# Patient Record
Sex: Female | Born: 1995
Health system: Southern US, Community
[De-identification: ages and names within clinical notes are randomized; demographics above are authoritative.]

## PROBLEM LIST (undated history)

## (undated) ENCOUNTER — Ambulatory Visit (HOSPITAL_COMMUNITY): Admission: EM | Source: Home / Self Care

## (undated) DIAGNOSIS — L309 Dermatitis, unspecified: Secondary | ICD-10-CM

## (undated) HISTORY — DX: Dermatitis, unspecified: L30.9

---
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
## 2017-04-13 IMAGING — CR DG CHEST 2V
2 series · 2 of 2 positions shown · non-contrast
Comparison: None in PACs

CLINICAL DATA: Cough and chest congestion, orthopnea,
musculoskeletal chest pain.

EXAM:
CHEST  2 VIEW

[w chest pa]
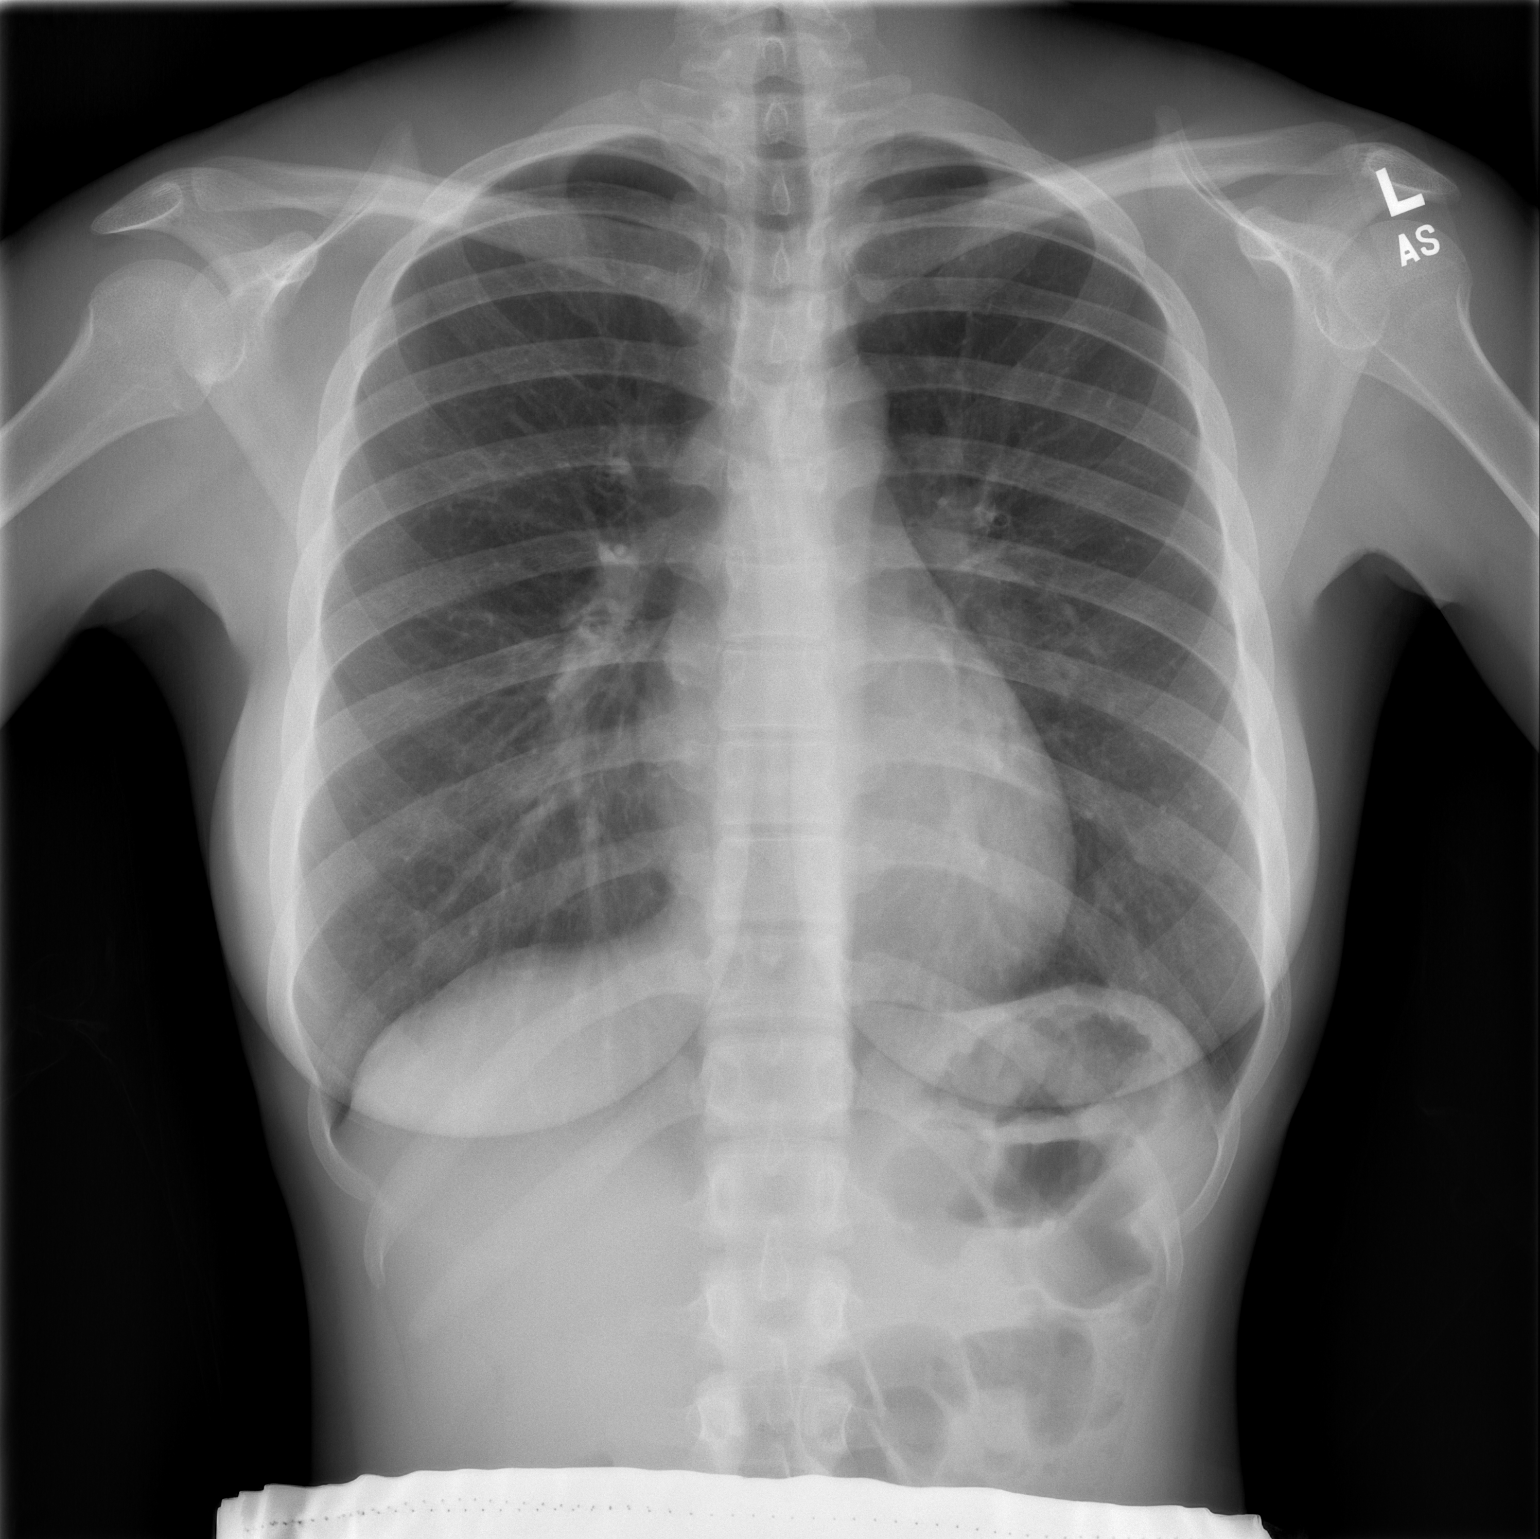

[w chest lat]
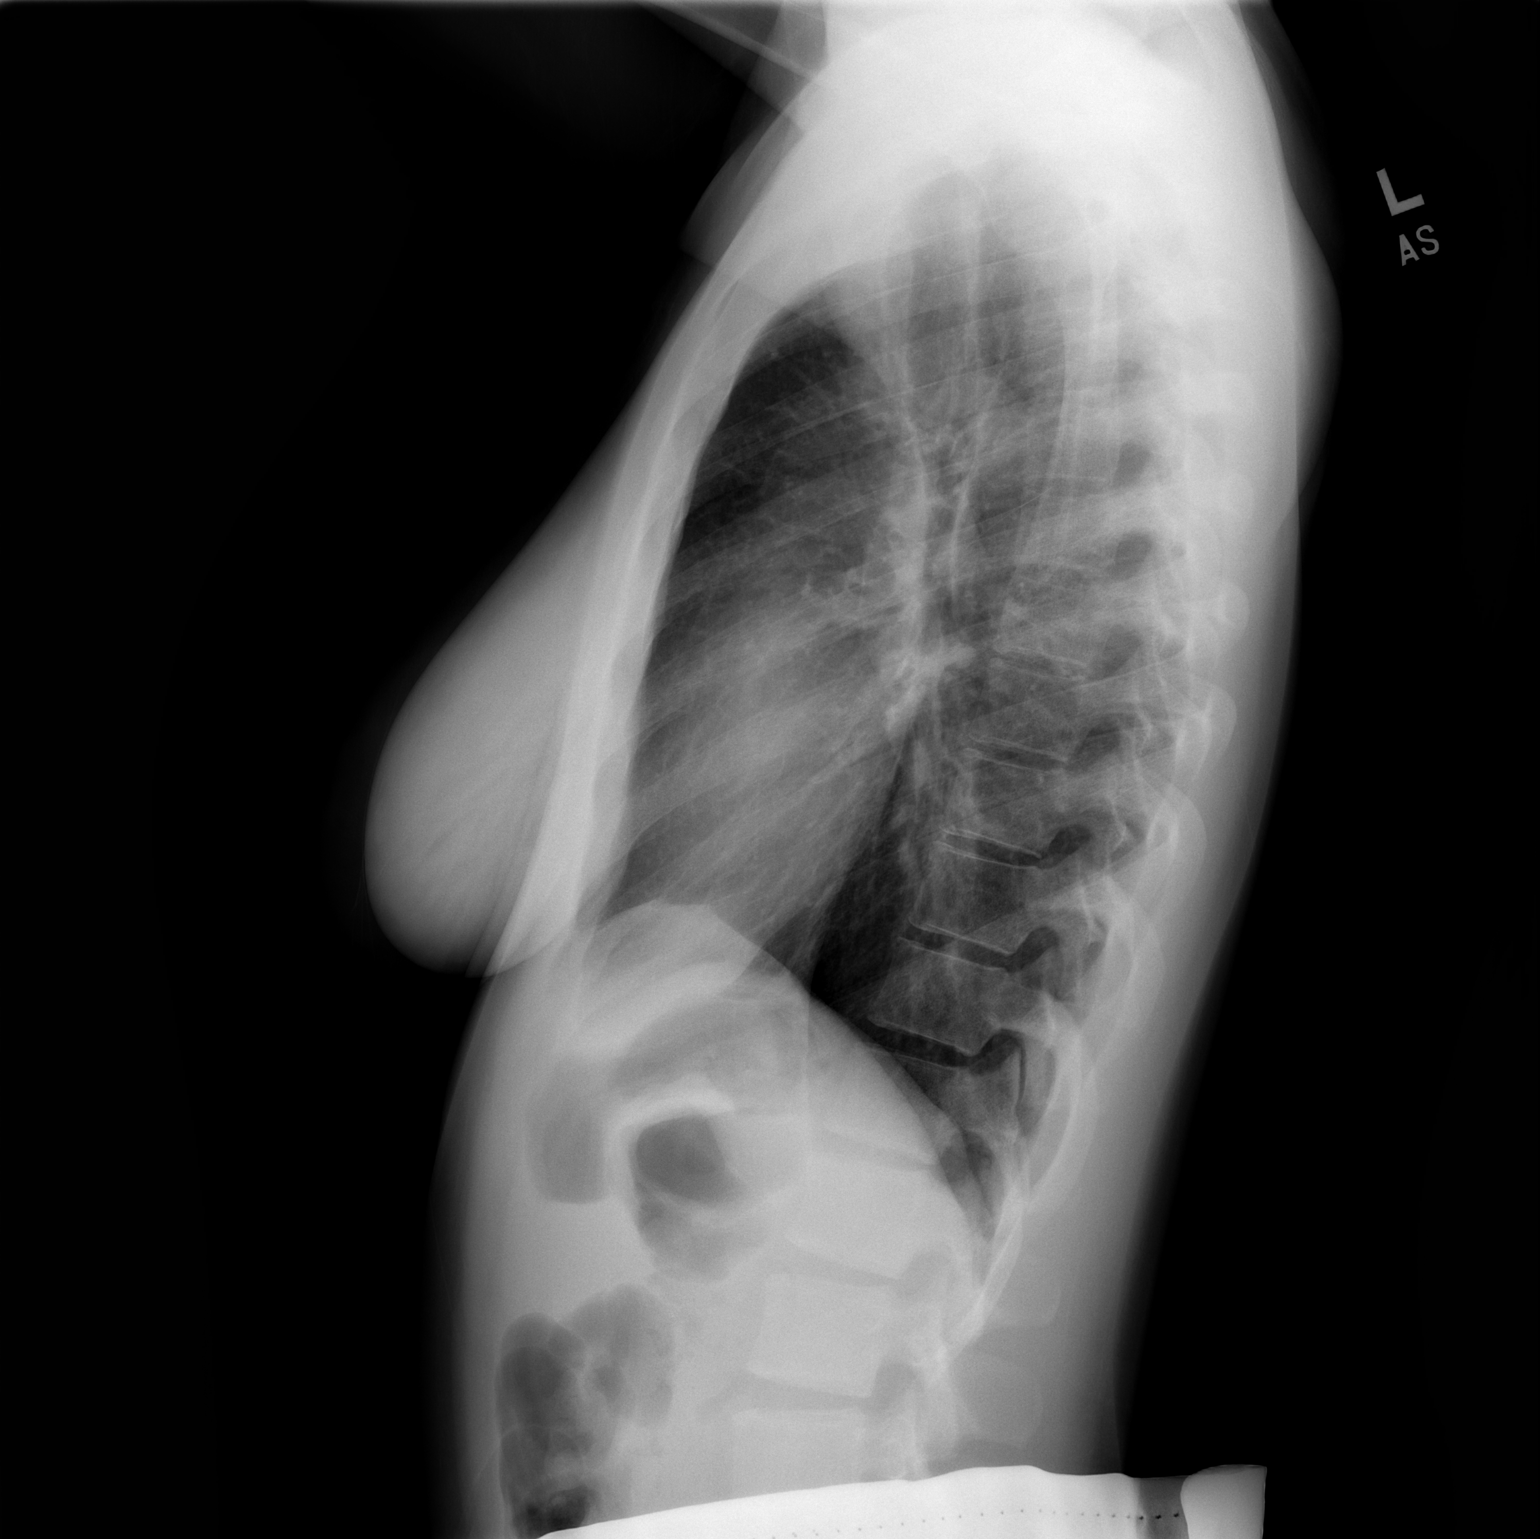

[2 of 2 positions shown; findings below may reference images not displayed]

FINDINGS: The lungs are mildly hyperinflated. There is no alveolar infiltrate.
There is no pleural effusion or pneumothorax. The cardiopericardial
silhouette is normal in size. The mediastinum is normal in width.
The bony thorax exhibits no acute abnormality.
IMPRESSION: Mild air trapping may reflect acute bronchitis. There is no alveolar
pneumonia.

## 2017-12-22 ENCOUNTER — Other Ambulatory Visit: Payer: Self-pay | Admitting: Obstetrics and Gynecology

## 2017-12-22 DIAGNOSIS — N6320 Unspecified lump in the left breast, unspecified quadrant: Secondary | ICD-10-CM

## 2017-12-22 DIAGNOSIS — N6310 Unspecified lump in the right breast, unspecified quadrant: Secondary | ICD-10-CM

## 2018-07-22 ENCOUNTER — Ambulatory Visit (HOSPITAL_COMMUNITY)
Admission: EM | Admit: 2018-07-22 | Discharge: 2018-07-22 | Disposition: A | Discharge status: Home / Self Care | Payer: BC Managed Care – PPO | Attending: Emergency Medicine | Admitting: Emergency Medicine

## 2018-07-22 ENCOUNTER — Other Ambulatory Visit: Payer: Self-pay

## 2018-07-22 DIAGNOSIS — J029 Acute pharyngitis, unspecified: Secondary | ICD-10-CM | POA: Insufficient documentation

## 2018-07-22 LAB — POCT RAPID STREP A: Streptococcus, Group A Screen (Direct): NEGATIVE

## 2018-07-22 MED ORDER — AMOXICILLIN 500 MG PO CAPS: 500.0000 mg | ORAL_CAPSULE | Freq: Three times a day (TID) | ORAL | 0 refills | Status: DC

## 2018-07-22 NOTE — ED Provider Notes (Signed)
[facility]    CSN: [id number] Arrival date & time: 07/22/18  1121     History   Chief Complaint Chief Complaint  Patient presents with  . Sore Throat    HPI [name] is a 23 y.o. female.   Patient presents with sore throat and swollen lymph node on the right side of her neck x1 week.  She denies fever, chills, otalgia, dysphagia, cough, shortness of breath, vomiting, diarrhea.  LMP: 3 weeks.  The history is provided by the patient.    Past Medical History:  Diagnosis Date  . Eczema     Patient Active Problem List   Diagnosis Date Noted  . Mass of breast, right 08/10/2013    No past surgical history on file.  OB History   No obstetric history on file.      Home Medications    Prior to Admission medications   Medication Sig Start Date End Date Taking? Authorizing Provider  amoxicillin (AMOXIL) 500 MG capsule Take 1 capsule (500 mg total) by mouth 3 (three) times daily for 10 days. 07/22/18 08/01/18  Mickie Bailate, Yilia Sacca H, NP    Family History Family History  Problem Relation Age of Onset  .  () Unknown     Social History Social History   Tobacco Use  . Smoking status: Never Smoker  Substance Use Topics  . Alcohol use: Not on file  . Drug use: Not on file     Allergies   Patient has no known allergies.   Review of Systems Review of Systems  Constitutional: Negative for chills and fever.  HENT: Positive for sore throat. Negative for congestion, ear pain, rhinorrhea, sinus pressure and trouble swallowing.   Eyes: Negative for pain and visual disturbance.  Respiratory: Negative for cough and shortness of breath.   Cardiovascular: Negative for chest pain and palpitations.  Gastrointestinal: Negative for abdominal pain, diarrhea and vomiting.  Genitourinary: Negative for dysuria and hematuria.  Musculoskeletal: Negative for arthralgias and back pain.  Skin: Negative for color change and rash.  Neurological: Negative for seizures and  syncope.  All other systems reviewed and are negative.    Physical Exam Triage Vital Signs ED Triage Vitals [07/22/18 1211]  Enc Vitals Group     BP 122/61     Pulse Rate 91     Resp 16     Temp 98.4 F (36.9 C)     Temp Source Oral     SpO2 100 %     Weight      Height      Head Circumference      Peak Flow      Pain Score 8     Pain Loc      Pain Edu?      Excl. in GC?    No data found.  Updated Vital Signs BP 122/61 (BP Location: Right Arm)   Pulse 91   Temp 98.4 F (36.9 C) (Oral)   Resp 16   SpO2 100%   Visual Acuity Right Eye Distance:   Left Eye Distance:   Bilateral Distance:    Right Eye Near:   Left Eye Near:    Bilateral Near:     Physical Exam Vitals signs and nursing note reviewed.  Constitutional:      General: She is not in acute distress.    Appearance: She is well-developed.  HENT:     Head: Normocephalic and atraumatic.     Right Ear: Tympanic membrane normal.  Left Ear: Tympanic membrane normal.     Nose: Nose normal.     Mouth/Throat:     Mouth: Mucous membranes are moist.     Pharynx: Posterior oropharyngeal erythema present. No oropharyngeal exudate.  Eyes:     Conjunctiva/sclera: Conjunctivae normal.  Neck:     Musculoskeletal: Neck supple.  Cardiovascular:     Rate and Rhythm: Normal rate and regular rhythm.  Pulmonary:     Effort: Pulmonary effort is normal. No respiratory distress.     Breath sounds: Normal breath sounds.  Abdominal:     Palpations: Abdomen is soft.     Tenderness: There is no abdominal tenderness. There is no guarding or rebound.  Lymphadenopathy:     Cervical: Cervical adenopathy present.  Skin:    General: Skin is warm and dry.  Neurological:     Mental Status: She is alert.      UC Treatments / Results  Labs (all labs ordered are listed, but only abnormal results are displayed) Labs Reviewed  CULTURE, GROUP A STREP [facility])  POCT RAPID STREP A    EKG   Radiology No results found.   Procedures Procedures (including critical care time)  Medications Ordered in UC Medications - No data to display  Initial Impression / Assessment and Plan / UC Course  I have reviewed the triage vital signs and the nursing notes.  Pertinent labs & imaging results that were available during my care of the patient were reviewed by me and considered in my medical decision making (see chart for details).   Acute pharyngitis.  Rapid strep negative today; culture pending.  Treating with amoxicillin x10 days, Tylenol or ibuprofen as needed for pain.  Discussed with patient that she should return here or follow-up with her PCP if she develops worsening pain, fever, chills, otalgia, cough, shortness of breath, vomiting, diarrhea, other concerns.  Discussed that she should go to the emergency department if she develops difficulty swallowing or breathing.     Final Clinical Impressions(s) / UC Diagnoses   Final diagnoses:  Acute pharyngitis, unspecified etiology     Discharge Instructions     Take the amoxicillin as prescribed for 10 days.  Take ibuprofen or Tylenol as needed for pain.    Return here or follow-up with your primary care provider if you develop worsening pain, fever, chills, earache, cough, shortness of breath, vomiting, diarrhea, other concerns.        ED Prescriptions    Medication Sig Dispense Auth. Provider   amoxicillin (AMOXIL) 500 MG capsule Take 1 capsule (500 mg total) by mouth 3 (three) times daily for 10 days. 30 capsule Sharion Balloon, NP     Controlled Substance Prescriptions [place] Controlled Substance Registry consulted? Not Applicable   Sharion Balloon, NP 07/22/18 1310

## 2018-07-22 NOTE — Discharge Instructions (Signed)
Take the amoxicillin as prescribed for 10 days.  Take ibuprofen or Tylenol as needed for pain.    Return here or follow-up with your primary care provider if you develop worsening pain, fever, chills, earache, cough, shortness of breath, vomiting, diarrhea, other concerns.

## 2018-07-22 NOTE — ED Triage Notes (Signed)
Per pt she has been having a sore throat that only hurts on the right side. Swelling in her tonsil and neck pain. No fevers, no chills

## 2018-07-24 LAB — CULTURE, GROUP A STREP (THRC)

## 2018-07-27 ENCOUNTER — Ambulatory Visit (HOSPITAL_COMMUNITY)
Admission: EM | Admit: 2018-07-27 | Discharge: 2018-07-27 | Disposition: A | Discharge status: Home / Self Care | Payer: BC Managed Care – PPO | Attending: Urgent Care | Admitting: Urgent Care

## 2018-07-27 ENCOUNTER — Encounter (HOSPITAL_COMMUNITY): Payer: Self-pay | Admitting: Emergency Medicine

## 2018-07-27 ENCOUNTER — Other Ambulatory Visit: Payer: Self-pay

## 2018-07-27 DIAGNOSIS — R0981 Nasal congestion: Secondary | ICD-10-CM | POA: Diagnosis present

## 2018-07-27 DIAGNOSIS — R059 Cough, unspecified: Secondary | ICD-10-CM

## 2018-07-27 DIAGNOSIS — B349 Viral infection, unspecified: Secondary | ICD-10-CM

## 2018-07-27 DIAGNOSIS — R432 Parageusia: Secondary | ICD-10-CM

## 2018-07-27 DIAGNOSIS — R430 Anosmia: Secondary | ICD-10-CM

## 2018-07-27 DIAGNOSIS — U071 COVID-19: Secondary | ICD-10-CM | POA: Insufficient documentation

## 2018-07-27 DIAGNOSIS — J029 Acute pharyngitis, unspecified: Secondary | ICD-10-CM

## 2018-07-27 DIAGNOSIS — R05 Cough: Secondary | ICD-10-CM

## 2018-07-27 LAB — POCT INFECTIOUS MONO SCREEN: Mono Screen: NEGATIVE

## 2018-07-27 NOTE — ED Triage Notes (Signed)
PT was seen last week for sore throat and was treated with antibiotics. PT took a few days of antibiotics, but quit two days ago. PT reports loss of taste and smell.

## 2018-07-27 NOTE — Discharge Instructions (Addendum)

## 2018-07-27 NOTE — ED Provider Notes (Addendum)
MRN: [id number] DOB: [birth year]  Subjective:   Jacqueline Schaefer is a 23 y.o. female presenting for recheck on her pharyngitis.  Reports that she has been ill for approximately 2 weeks.  Patient was seen last week and started on amoxicillin for clinical treatment of pharyngitis.  She reports that her symptoms have improved slightly but she still has head congestion, dry cough worse at night.  She has now lost her sense of taste and smell.  She stopped amoxicillin to see if this was a side effect but has not noticed a difference while being on amoxicillin.  Patient works as a Programme researcher, broadcasting/film/video, works at [employer].  Would like to get tested for COVID-19.  No current facility-administered medications for this encounter.   Current Outpatient Medications:  .  amoxicillin (AMOXIL) 500 MG capsule, Take 1 capsule (500 mg total) by mouth 3 (three) times daily for 10 days., Disp: 30 capsule, Rfl: 0   No Known Allergies  Past Medical History:  Diagnosis Date  . Eczema     History reviewed. No pertinent surgical history.  Review of Systems  Constitutional: Negative for fever and malaise/fatigue.  HENT: Positive for congestion and sore throat. Negative for ear pain and sinus pain.   Eyes: Negative for blurred vision, double vision, discharge and redness.  Respiratory: Positive for cough. Negative for hemoptysis, shortness of breath and wheezing.   Cardiovascular: Negative for chest pain.  Gastrointestinal: Negative for abdominal pain, diarrhea, nausea and vomiting.  Genitourinary: Negative for dysuria, flank pain and hematuria.  Musculoskeletal: Negative for myalgias.  Skin: Negative for rash.  Neurological: Negative for dizziness, weakness and headaches.  Psychiatric/Behavioral: Negative for depression and substance abuse.    Objective:   Vitals: BP 127/80   Pulse 72   Temp 99 F (37.2 C) (Oral)   Resp 16   LMP 06/21/2018   SpO2 100%   Physical Exam Constitutional:      General: She is not in acute  distress.    Appearance: Normal appearance. She is well-developed. She is not ill-appearing, toxic-appearing or diaphoretic.  HENT:     Head: Normocephalic and atraumatic.     Right Ear: Tympanic membrane and ear canal normal. No drainage or tenderness. No middle ear effusion. Tympanic membrane is not erythematous.     Left Ear: Tympanic membrane and ear canal normal. No drainage or tenderness.  No middle ear effusion. Tympanic membrane is not erythematous.     Nose: Nose normal. No congestion or rhinorrhea.     Mouth/Throat:     Mouth: Mucous membranes are moist. No oral lesions.     Pharynx: Oropharynx is clear. No pharyngeal swelling, oropharyngeal exudate, posterior oropharyngeal erythema or uvula swelling.     Tonsils: No tonsillar exudate or tonsillar abscesses.  Eyes:     General: No scleral icterus.    Extraocular Movements: Extraocular movements intact.     Right eye: Normal extraocular motion.     Left eye: Normal extraocular motion.     Conjunctiva/sclera: Conjunctivae normal.     Pupils: Pupils are equal, round, and reactive to light.  Neck:     Musculoskeletal: Normal range of motion and neck supple.  Cardiovascular:     Rate and Rhythm: Normal rate and regular rhythm.     Pulses: Normal pulses.     Heart sounds: Normal heart sounds. No murmur. No friction rub. No gallop.   Pulmonary:     Effort: Pulmonary effort is normal. No respiratory distress.     Breath sounds:  Normal breath sounds. No stridor. No wheezing, rhonchi or rales.  Lymphadenopathy:     Cervical: No cervical adenopathy.  Skin:    General: Skin is warm and dry.     Findings: No rash.  Neurological:     General: No focal deficit present.     Mental Status: She is alert and oriented to person, place, and time.  Psychiatric:        Mood and Affect: Mood normal.        Behavior: Behavior normal.        Thought Content: Thought content normal.    Monospot negative by verbal report.  Assessment and  Plan :   1. Viral syndrome   2. Sore throat   3. Head congestion   4. Cough   5. Loss of taste   6. Loss of smell     Likely viral in etiology. Counseled patient on nature of COVID-19 including modes of transmission, diagnostic testing, management and supportive care.  Offered symptomatic relief.  Counseled patient that she can continue amoxicillin but I do not see a reason to continue it given her physical exam findings, negative rapid strep and negative strep culture.  COVID 19 testing is pending. Counseled patient on potential for adverse effects with medications prescribed/recommended today, ER and return-to-clinic precautions discussed, patient verbalized understanding.     Wallis BambergMani, Sharlize Hoar, PA-C 07/27/18 1539    Wallis BambergMani, Malissie Musgrave, PA-C 07/27/18 1540

## 2018-08-02 LAB — NOVEL CORONAVIRUS, NAA (HOSP ORDER, SEND-OUT TO REF LAB; TAT 18-24 HRS): SARS-CoV-2, NAA: DETECTED — AB

## 2018-08-03 ENCOUNTER — Telehealth (HOSPITAL_COMMUNITY): Payer: Self-pay | Admitting: Emergency Medicine

## 2018-08-03 NOTE — Telephone Encounter (Signed)
Your test for COVID-19 was positive, meaning that you were infected with the novel coronavirus and could give the germ to others.  Please continue isolation at home, for at least 10 days since the start of your fever/cough/breathlessness and until you have had 3 consecutive days without fever (without taking a fever reducer) and with cough/breathlessness improving. Please continue good preventive care measures, including:  frequent hand-washing, avoid touching your face, cover coughs/sneezes, stay out of crowds and keep a 6 foot distance from others.  Recheck or go to the nearest hospital ED tent for re-assessment if fever/cough/breathlessness return.  Attempted to reach patient. No answer at this time. Voicemail left.    

## 2018-08-03 NOTE — Telephone Encounter (Signed)
Pt returned call about covid results. ALl questions answered.

## 2023-06-06 NOTE — Telephone Encounter (Signed)
 Called pt, no answer. LM on VM with contact info for this office. CALL # 1

## 2023-06-07 NOTE — Telephone Encounter (Signed)
 Called pt, no answer. LM on VM with contact info for this office. Pt needs weekly HCG scheduled.

## 2023-06-08 NOTE — Telephone Encounter (Signed)
 Called pt regarding scheduling weekly HCG. No answer, LM on VM with contact info for this office. Pt has not viewed My Chart message.

## 2023-06-10 NOTE — Telephone Encounter (Signed)
 Called pt, no answer, LM on VM with contact # for this office. Pt has not viewed provider result message.

## 2023-06-10 NOTE — Telephone Encounter (Signed)
-----   Message from Elma Dunn, MD sent at 06/06/2023  2:52 PM EDT ----- One more weekly hcg needed! ----- Message ----- From: Interface, Lab In [place] Sent: 06/06/2023   2:44 PM EDT To: [facility] Attending Pool

## 2023-06-14 NOTE — Telephone Encounter (Signed)
 Call to patient.  No answer, LMCTB [phone].  Patient needs HCG level in 1 month(07/14/23).

## 2023-06-17 NOTE — Telephone Encounter (Signed)
 Call to patient.  No answer, LMCTB [phone].  Patient needs labs drawn in 1 month (07/14/23).

## 2023-06-17 NOTE — Telephone Encounter (Signed)
-----   Message from Elma Dunn, MD sent at 06/14/2023  4:18 PM EDT ----- Repeat in one month ----- Message ----- From: Interface, Lab In [place] Sent: 06/14/2023   4:14 PM EDT To: [facility] Attending Pool

## 2023-06-20 NOTE — Telephone Encounter (Signed)
 Call to patient for follow up molar pregnancy.  Patient is due for monthly HCG on 07/14/23.  The call was not answered.  LMTCB [phone].

## 2023-07-14 NOTE — Telephone Encounter (Signed)
-----   Message from Dena Backer, MD sent at 07/14/2023  3:20 PM EDT ----- Patient needs hcg in 1 month for molar pregnancy follow-up.  Future order placed by me.  Please contact patient to assist with scheduling lab draw.  ----- Message ----- From: Interface, Lab In [place] Sent: 07/14/2023   2:50 PM EDT To: [facility] Attending Pool

## 2023-07-14 NOTE — Telephone Encounter (Signed)
 Attempt to call pt, no answer. LMTCB. Sent mychart msg.

## 2023-07-19 NOTE — Progress Notes (Signed)
 Pt present for RN visit for Depo Provera injection; out of range. UPT prior to injection negative. Depo Provera 150 mg IM via left deltoid, pt tolerated injection well. Provided pt with depo calendar with return range highlighted 10/04/23-10/18/23

## 2023-08-06 NOTE — Progress Notes (Signed)
 Subjective:     Patient ID: Jacqueline Schaefer is a 28 y.o. female who presents to clinic for evaluation of dog bite to dorsal aspect of both right and left hands.  Patient states injury happened yesterday after trying to break up a fight between her dog and neighborhood dog.  Patient states she was walking her dog when another dog broke his leash and attacked her dog.  Patient states last tetanus vaccine was 2022.  Patient states contacted police last night regarding dog bite.  Patient states swelling and pain to dorsal aspect of right hand.  Patient states taking 800 mg of ibuprofen and applying ice to area.  Denies any fever, chills, sweats.  Denies any pus formation from wounds to dorsal aspect of both hands.     Chief Complaint: Animal Bite (Left and right hand- Happened last night)    PHQ-2 Score:    History of Present Illness: Jacqueline Schaefer is a 28 y.o. female who presents to clinic for evaluation of dog bite to dorsal aspect of both right and left hands.  Patient states injury happened yesterday after trying to break up a fight between her dog and neighborhood dog.  Patient states she was walking her dog when another dog broke his leash and attacked her dog.  Patient states last tetanus vaccine was 2022.  Patient states contacted police last night regarding dog bite.  Patient states swelling and pain to dorsal aspect of right hand.  Patient states taking 800 mg of ibuprofen and applying ice to area.  Denies any fever, chills, sweats.  Denies any pus formation from wounds to dorsal aspect of both hands.  Animal Bite     The following portions of the patient's history were reviewed and updated as appropriate: medications, allergies, past medical history, family history, social history, surgical history, current problems.  Medications Prior to Visit[1]  Review of Systems  Constitutional: Negative.   HENT: Negative.    Eyes: Negative.   Respiratory: Negative.    Cardiovascular: Negative.    Gastrointestinal: Negative.   Endocrine: Negative.   Genitourinary: Negative.   Musculoskeletal: Negative.   Skin:  Positive for wound.       Swelling, redness, tenderness to dorsal aspect of right hand along with irregularly shaped 8 mm x 5 mm puncture wound to dorsal aspect of right hand. Puncture wound to dorsal aspect of left hand measuring approximately 6 mm x 5 mm to dorsal aspect of left hand.  Allergic/Immunologic: Negative.   Neurological: Negative.   Hematological: Negative.   Psychiatric/Behavioral: Negative.       Objective:    BP 120/77 (BP Location: Right lower arm, BP Position: Sitting, Cuff Size: Adult (Navy Blue))   Pulse 102   Temp 98.4 F (36.9 C) (Tympanic)   Resp 16   Ht 1.676 m (5' 6)   Wt 61.1 kg (134 lb 11.2 oz)   LMP 08/04/2023 (Approximate)   SpO2 100%   BMI 21.74 kg/m      Physical Exam Vitals and nursing note reviewed.  Constitutional:      Appearance: She is well-groomed.     Comments: Patient is sitting on exam room chair and in no acute distress as provider enters room  HENT:     Head: Normocephalic.     Right Ear: Hearing and external ear normal.     Left Ear: Hearing and external ear normal.     Nose: Nose normal.     Mouth/Throat:     Lips: Pink.  Mouth: Mucous membranes are moist.     Pharynx: Oropharynx is clear. Uvula midline.   Eyes:     General: Lids are normal.     Extraocular Movements: Extraocular movements intact.     Conjunctiva/sclera: Conjunctivae normal.   Neck:     Trachea: Trachea and phonation normal.   Cardiovascular:     Rate and Rhythm: Normal rate and regular rhythm.     Heart sounds: Normal heart sounds.  Pulmonary:     Effort: Pulmonary effort is normal.     Breath sounds: Normal breath sounds and air entry.  Abdominal:     General: Abdomen is flat. Bowel sounds are normal.     Palpations: Abdomen is soft.   Musculoskeletal:     Cervical back: Normal range of motion and neck supple.   Skin:     General: Skin is warm.     Capillary Refill: Capillary refill takes less than 2 seconds.     Findings: Wound present.         Comments: Swelling, redness, tenderness to dorsal aspect of right hand along with irregularly shaped 8 mm x 5 mm puncture wound to dorsal aspect of right hand. Puncture wound to dorsal aspect of left hand measuring approximately 6 mm x 5 mm to dorsal aspect of left hand.   Neurological:     General: No focal deficit present.     Mental Status: She is alert.     GCS: GCS eye subscore is 4. GCS verbal subscore is 5. GCS motor subscore is 6.     Cranial Nerves: Cranial nerves 2-12 are intact.   Psychiatric:        Attention and Perception: Attention normal.        Mood and Affect: Mood normal.        Speech: Speech normal.        Behavior: Behavior is cooperative.        Thought Content: Thought content normal.        Cognition and Memory: Cognition normal.        Judgment: Judgment normal.        Assessment:    1. Dog bite of right hand (Primary)  - XR Hand Right; Future  Due to the swelling in right hand, an x-ray was ordered.  No x-ray technician in clinic today.  Patient will go to [facility] radiology for an x-ray.  The results will pend in MyChart.  This clinic will comment on results in MyChart.  2. Dog bite of left hand   Augmentin 1 pill twice a day for 10 days.  Prescribed ibuprofen 800 mg 1 pill every 8 hours to help with pain.  ED precautions or 911 call related to coldness of right hand or left hand, worsening of swelling, numbness, fevers not brought down by ibuprofen or Tylenol, trouble breathing trouble swallowing or any general worsening or concerns.  Take medication as directed. Watch for and report side effects of medication, cal with questions or concerns.   Increase fluid intake to 64 ounces of water  per day.  Follow up as needed.   This documentation was generated through the use of dictation and/or voice recognition  software, and as such, may contain spelling or other transcription errors. Any questions regarding the content of this documentation should be directed to the individual who electronically signed.  Patient voices understanding and acceptance of this advice and will call back if any further questions or concerns.  It has been my  pleasure to care for you today in clinic.  Please call clinic for any questions or concerns.  Please return to clinic for any further concerns.  Dianne Lore APRN, DNP,FNP-BC, CPNP-PC      Plan:    Orders Placed This Encounter  Procedures  . XR Hand Right   Medications Ordered This Encounter  Medications  . amoxicillin -clavulanate (AUGMENTIN) 875-125 mg per tablet    Sig: Take 1 tablet by mouth 2 (two) times a day for 10 days.    Dispense:  20 tablet    Refill:  0  . ibuprofen (MOTRIN) 800 MG tablet    Sig: Take 1 tablet (800 mg total) by mouth every 8 (eight) hours as needed for pain for up to 7 days.    Dispense:  21 tablet    Refill:  0   1. Dog bite of right hand (Primary)  - XR Hand Right; Future  Due to the swelling in right hand, an x-ray was ordered.  No x-ray technician in clinic today.  Patient will go to [facility] radiology for an x-ray.  The results will pend in MyChart.  This clinic will comment on results in MyChart.  2. Dog bite of left hand   Augmentin 1 pill twice a day for 10 days.  Prescribed ibuprofen 800 mg 1 pill every 8 hours to help with pain.  ED precautions or 911 call related to coldness of right hand or left hand, worsening of swelling, numbness, fevers not brought down by ibuprofen or Tylenol, trouble breathing trouble swallowing or any general worsening or concerns.  Take medication as directed. Watch for and report side effects of medication, cal with questions or concerns.   Increase fluid intake to 64 ounces of water  per day.  Follow up as needed.   This documentation was generated through the use of dictation  and/or voice recognition software, and as such, may contain spelling or other transcription errors. Any questions regarding the content of this documentation should be directed to the individual who electronically signed.  Patient voices understanding and acceptance of this advice and will call back if any further questions or concerns.  It has been my pleasure to care for you today in clinic.  Please call clinic for any questions or concerns.  Please return to clinic for any further concerns.  Dianne Lore APRN, DNP,FNP-BC, CPNP-PC     Goals   None        Ambulatory billing notes: In developing the above medical care plan, I completed a medically appropriate history and/or examination with indirect supervision of physicians who were available for collaboration but not directly involved with this encounter.     Follow-Up:   Follow up as needed.   In the instance that text in this note was generated using an ambient documentation service, we discussed that a recording device may be used to record and summarize our discussion today. All persons present during the encounter consented to its use.        [1] Outpatient Medications Prior to Visit  Medication Sig Dispense Refill  . miSOPROStoL (CYTOTEC) 200 MCG tablet Take 4 tablets (800 mcg total) by mouth once for 1 dose. Place two pills between the gums and cheek on each side of your mouth. Hold in place for 30 minutes, and swallow what is left with water . 4 tablet 0   No facility-administered medications prior to visit.

## 2023-08-07 ENCOUNTER — Encounter (HOSPITAL_COMMUNITY): Payer: Self-pay | Admitting: Emergency Medicine

## 2023-08-07 ENCOUNTER — Ambulatory Visit (HOSPITAL_COMMUNITY): Admission: EM | Admit: 2023-08-07 | Discharge: 2023-08-07 | Disposition: A | Discharge status: Home / Self Care

## 2023-08-07 ENCOUNTER — Ambulatory Visit (INDEPENDENT_AMBULATORY_CARE_PROVIDER_SITE_OTHER)

## 2023-08-07 DIAGNOSIS — T148XXA Other injury of unspecified body region, initial encounter: Secondary | ICD-10-CM | POA: Diagnosis not present

## 2023-08-07 DIAGNOSIS — W540XXA Bitten by dog, initial encounter: Secondary | ICD-10-CM

## 2023-08-07 DIAGNOSIS — L089 Local infection of the skin and subcutaneous tissue, unspecified: Secondary | ICD-10-CM

## 2023-08-07 DIAGNOSIS — S61451A Open bite of right hand, initial encounter: Secondary | ICD-10-CM

## 2023-08-07 MED ORDER — KETOROLAC TROMETHAMINE 30 MG/ML IJ SOLN
30.0000 mg | Freq: Once | INTRAMUSCULAR | Status: AC
  Administered 2023-08-07: 30 mg via INTRAMUSCULAR

## 2023-08-07 MED ORDER — CEFTRIAXONE SODIUM 500 MG IJ SOLR
INTRAMUSCULAR | Status: AC
  Filled 2023-08-07: qty 500

## 2023-08-07 MED ORDER — KETOROLAC TROMETHAMINE 10 MG PO TABS: 10.0000 mg | ORAL_TABLET | Freq: Four times a day (QID) | ORAL | 0 refills | Status: AC | PRN

## 2023-08-07 MED ORDER — KETOROLAC TROMETHAMINE 30 MG/ML IJ SOLN
INTRAMUSCULAR | Status: AC
  Filled 2023-08-07: qty 1

## 2023-08-07 MED ORDER — CEFTRIAXONE SODIUM 500 MG IJ SOLR
500.0000 mg | INTRAMUSCULAR | Status: DC
  Administered 2023-08-07: 500 mg via INTRAMUSCULAR

## 2023-08-07 MED ORDER — STERILE WATER FOR INJECTION IJ SOLN
INTRAMUSCULAR | Status: AC
  Filled 2023-08-07: qty 10

## 2023-08-07 MED ORDER — RABIES VACCINE, PCEC IM SUSR: 1.0000 mL | Freq: Once | INTRAMUSCULAR | Status: DC

## 2023-08-07 MED ORDER — RABIES IMMUNE GLOBULIN 300 UNIT/2ML IJ SOLN
20.0000 [IU]/kg | Freq: Once | INTRAMUSCULAR | Status: DC
Start: 2023-08-07 — End: 2023-08-07

## 2023-08-07 MED ORDER — MUPIROCIN 2 % EX OINT: 1.0000 | TOPICAL_OINTMENT | Freq: Two times a day (BID) | CUTANEOUS | 1 refills | Status: AC

## 2023-08-07 NOTE — ED Triage Notes (Signed)
 Pt repots on Friday she was bit on bilateral hands by a dog that attacked her and her dog. Owner of dog told the police that the dog was up to date but patient doesn't know if true or not.  Pt reports that having pain really bad esp in right middle finger.  Pt was seen at an UC on Friday and prescribed antibiotics and Ibuprofen 800 mg. They didn't have the ibuprofen at pharmacy but pat did get antibiotics and taking it.

## 2023-08-07 NOTE — Discharge Instructions (Addendum)
 Infected dog bite of the right hand.  X-ray done today and shows no significant changes or foreign bodies but there is some soft tissue swelling consistent with an infection.  Due to the unknown status of the dog we recommend starting the rabies series if you can not completely confirm the dogs status.  We have also given a shot of Rocephin  today due to the extent of the infection present.  Will order mupirocin  to apply 1-2 times daily to the hand and as needed.  Recommend monitoring the symptoms and if there is any worsening or fevers develop then recommend returning to urgent care.  Toradol  shot given today to help with the pain.  We will call in Toradol  10 mg every 6-8 hours as needed for pain.  Please wait 8 hours before taking this medication by mouth as we gave you a shot of this today

## 2023-08-07 NOTE — ED Notes (Signed)
 PT refuses  Rabies injections tonight. Pt has Contacted the owner of dog for Vet name and number. Pt plans to call vet Monday to confirm dogs vaccine status.

## 2023-08-07 NOTE — ED Provider Notes (Addendum)
 [facility]    CSN: [id number] Arrival date & time: 08/07/23  1542      History   Chief Complaint Chief Complaint  Patient presents with   Animal Bite    HPI Jacqueline Schaefer is a 28 y.o. female.   28 year old female who presents urgent care with complaints of swelling and pain on the right hand after dog bite.  She reports that she was attacked by dog on Friday while trying to defend her dog.  She reports that she was seen at urgent care at [facility] and was started on antibiotics.  She was sent for an x-ray but did not get it yet.  She reports that her right hand has become more painful and more swollen.  She is having trouble sleeping.  She was started on Augmentin.  She did not get started on the rabies vaccine  at that time because she thought that the dog might be up-to-date but now she is unsure.  Her tetanus vaccine is up-to-date.  She denies any fevers or chills.  She is doing wound care on the hand.  She does have bites on the left hand as well but those are healing appropriately.   Animal Bite Associated symptoms: no fever and no rash     Past Medical History:  Diagnosis Date   Eczema     Patient Active Problem List   Diagnosis Date Noted   Mass of breast, right 08/10/2013    History reviewed. No pertinent surgical history.  OB History   No obstetric history on file.      Home Medications    Prior to Admission medications   Medication Sig Start Date End Date Taking? Authorizing Provider  amoxicillin -clavulanate (AUGMENTIN) 875-125 MG tablet Take 1 tablet by mouth. 08/06/23 08/16/23 Yes [provider]  ketorolac  (TORADOL ) 10 MG tablet Take 1 tablet (10 mg total) by mouth every 6 (six) hours as needed. 08/07/23  Yes Broughton Eppinger A, PA-C  mupirocin  ointment (BACTROBAN ) 2 % Apply 1 Application topically 2 (two) times daily. 08/07/23  Yes Teresa Almarie LABOR, PA-C    Family History No family history on file.  Social History Social History    Tobacco Use   Smoking status: Never     Allergies   Patient has no known allergies.   Review of Systems Review of Systems  Constitutional:  Negative for chills and fever.  HENT:  Negative for ear pain and sore throat.   Eyes:  Negative for pain and visual disturbance.  Respiratory:  Negative for cough and shortness of breath.   Cardiovascular:  Negative for chest pain and palpitations.  Gastrointestinal:  Negative for abdominal pain and vomiting.  Genitourinary:  Negative for dysuria and hematuria.  Musculoskeletal:  Negative for arthralgias and back pain.  Skin:  Positive for wound (Bilateral hands). Negative for color change and rash.  Neurological:  Negative for seizures and syncope.  All other systems reviewed and are negative.    Physical Exam Triage Vital Signs ED Triage Vitals  Encounter Vitals Group     BP 08/07/23 1708 111/78     Girls Systolic BP Percentile --      Girls Diastolic BP Percentile --      Boys Systolic BP Percentile --      Boys Diastolic BP Percentile --      Pulse Rate 08/07/23 1708 77     Resp 08/07/23 1708 15     Temp 08/07/23 1708 98.6 F (37 C)  Temp Source 08/07/23 1708 Oral     SpO2 08/07/23 1708 98 %     Weight --      Height --      Head Circumference --      Peak Flow --      Pain Score 08/07/23 1705 7     Pain Loc --      Pain Education --      Exclude from Growth Chart --    No data found.  Updated Vital Signs BP 111/78 (BP Location: Left Arm)   Pulse 77   Temp 98.6 F (37 C) (Oral)   Resp 15   LMP 08/06/2023 (Exact Date)   SpO2 98%   Visual Acuity Right Eye Distance:   Left Eye Distance:   Bilateral Distance:    Right Eye Near:   Left Eye Near:    Bilateral Near:     Physical Exam Vitals and nursing note reviewed.  Constitutional:      General: She is not in acute distress.    Appearance: She is well-developed.  HENT:     Head: Normocephalic and atraumatic.  Eyes:     Conjunctiva/sclera:  Conjunctivae normal.  Cardiovascular:     Rate and Rhythm: Normal rate and regular rhythm.  Pulmonary:     Effort: Pulmonary effort is normal. No respiratory distress.  Musculoskeletal:        General: No swelling.     Cervical back: Neck supple.  Skin:    General: Skin is warm and dry.     Capillary Refill: Capillary refill takes less than 2 seconds.      Neurological:     Mental Status: She is alert.  Psychiatric:        Mood and Affect: Mood normal.      UC Treatments / Results  Labs (all labs ordered are listed, but only abnormal results are displayed) Labs Reviewed - No data to display  EKG   Radiology DG Hand Complete Right Result Date: 08/07/2023 CLINICAL DATA:  Dog bite 2 days ago. EXAM: RIGHT HAND - COMPLETE 3+ VIEW COMPARISON:  None Available. FINDINGS: There is no evidence of fracture or dislocation. There is no evidence of arthropathy or other focal bone abnormality. Soft tissue swelling along the dorsal hand. No radiopaque foreign body. IMPRESSION: 1. No acute osseous abnormality. 2. Dorsal soft tissue swelling of the hand. No radiopaque foreign body. Electronically Signed   By: Harrietta Sherry M.D.   On: 08/07/2023 17:46    Procedures Procedures (including critical care time)  Medications Ordered in UC Medications  rabies immune globulin  (HYPERRAB) injection 20 Units/kg (has no administration in time range)  rabies vaccine  (RABAVERT ) injection 1 mL (has no administration in time range)  cefTRIAXone  (ROCEPHIN ) injection 500 mg (500 mg Intramuscular Given 08/07/23 1735)  ketorolac  (TORADOL ) 30 MG/ML injection 30 mg (30 mg Intramuscular Given 08/07/23 1735)    Initial Impression / Assessment and Plan / UC Course  I have reviewed the triage vital signs and the nursing notes.  Pertinent labs & imaging results that were available during my care of the patient were reviewed by me and considered in my medical decision making (see chart for details).     Dog bite  of right hand, initial encounter - Plan: DG Hand Complete Right, DG Hand Complete Right  Infection of wound due to dog bite - Plan: DG Hand Complete Right, DG Hand Complete Right   Infected dog bite of the right hand.  X-ray done today and shows no significant changes or foreign bodies but there is some soft tissue swelling consistent with an infection.  Due to the unknown status of the dog we recommend starting the rabies series if you can not completely confirm the dogs status.  We have also given a shot of Rocephin  today due to the extent of the infection present.  Will order mupirocin  to apply 1-2 times daily to the hand and as needed.  Recommend monitoring the symptoms and if there is any worsening or fevers develop then recommend returning to urgent care.  Toradol  shot given today to help with the pain.  We will call in Toradol  10 mg every 6-8 hours as needed for pain.  Please wait 8 hours before taking this medication by mouth as we gave you a shot of this today  Final Clinical Impressions(s) / UC Diagnoses   Final diagnoses:  Dog bite of right hand, initial encounter  Infection of wound due to dog bite     Discharge Instructions      Infected dog bite of the right hand.  X-ray done today and shows no significant changes or foreign bodies but there is some soft tissue swelling consistent with an infection.  Due to the unknown status of the dog we recommend starting the rabies series if you can not completely confirm the dogs status.  We have also given a shot of Rocephin  today due to the extent of the infection present.  Will order mupirocin  to apply 1-2 times daily to the hand and as needed.  Recommend monitoring the symptoms and if there is any worsening or fevers develop then recommend returning to urgent care.  Toradol  shot given today to help with the pain.  We will call in Toradol  10 mg every 6-8 hours as needed for pain.  Please wait 8 hours before taking this medication by mouth as we  gave you a shot of this today      ED Prescriptions     Medication Sig Dispense Auth. Provider   mupirocin  ointment (BACTROBAN ) 2 % Apply 1 Application topically 2 (two) times daily. 22 g Mann Skaggs A, PA-C   ketorolac  (TORADOL ) 10 MG tablet Take 1 tablet (10 mg total) by mouth every 6 (six) hours as needed. 20 tablet Teresa Almarie LABOR, [place]      PDMP not reviewed this encounter.   Teresa Almarie LABOR, PA-C 08/07/23 1729    Teresa Almarie LABOR, PA-C 08/07/23 1754
